# Patient Record
Sex: Male | Born: 1970 | Race: White | Hispanic: No | Marital: Married | Smoking: Current every day smoker
Health system: Southern US, Community
[De-identification: ages and names within clinical notes are randomized; demographics above are authoritative.]

## PROBLEM LIST (undated history)

## (undated) DIAGNOSIS — E119 Type 2 diabetes mellitus without complications: Secondary | ICD-10-CM

## (undated) DIAGNOSIS — I10 Essential (primary) hypertension: Secondary | ICD-10-CM

## (undated) DIAGNOSIS — E7800 Pure hypercholesterolemia, unspecified: Secondary | ICD-10-CM

---

## 2004-03-17 ENCOUNTER — Encounter: Admission: RE | Admit: 2004-03-17 | Discharge: 2004-03-17 | Payer: Self-pay | Admitting: Internal Medicine

## 2004-09-22 ENCOUNTER — Encounter: Admission: RE | Admit: 2004-09-22 | Discharge: 2004-09-22 | Payer: Self-pay | Admitting: Cardiovascular Disease

## 2004-10-03 ENCOUNTER — Ambulatory Visit (HOSPITAL_COMMUNITY): Admission: RE | Admit: 2004-10-03 | Discharge: 2004-10-03 | Payer: Self-pay | Admitting: Cardiovascular Disease

## 2004-11-16 ENCOUNTER — Emergency Department (HOSPITAL_COMMUNITY): Admission: EM | Admit: 2004-11-16 | Discharge: 2004-11-16 | Payer: Self-pay | Admitting: Family Medicine

## 2005-01-30 ENCOUNTER — Emergency Department (HOSPITAL_COMMUNITY): Admission: EM | Admit: 2005-01-30 | Discharge: 2005-01-30 | Payer: Self-pay | Admitting: Emergency Medicine

## 2010-05-19 ENCOUNTER — Encounter: Admission: RE | Admit: 2010-05-19 | Discharge: 2010-05-19 | Payer: Self-pay | Admitting: Internal Medicine

## 2010-06-18 ENCOUNTER — Encounter: Admission: RE | Admit: 2010-06-18 | Discharge: 2010-06-18 | Payer: Self-pay | Admitting: Internal Medicine

## 2010-12-12 NOTE — Cardiovascular Report (Signed)
NAME:  DIMITRIY, CARRERAS.:  [id number]   MEDICAL RECORD NO.:  [id number]          PATIENT TYPE:  OIB   LOCATION:  2889                         FACILITY:  [facility]   PHYSICIAN:  Nanetta Batty, M.D.   DATE OF BIRTH:  10-26-1970   DATE OF PROCEDURE:  10/03/2004  DATE OF DISCHARGE:                              CARDIAC CATHETERIZATION   INDICATIONS:  Mr. Cassatt is a 40 year old gentleman patient of Dr. Ralene Ok who was seen in the office September 22, 2004 with chest pain.  He  has newly diagnosed diabetes and hyperlipidemia.  He has a brother who has  Marfan syndrome and mother that had bypass surgery.  Cardiolite stress test  was nondiagnostic, but because of ongoing chest pain presents now for  diagnostic coronary arteriography o rule out ischemic etiology.   PROCEDURE DESCRIPTION:  The patient was brought to the second floor [facility] in the postabsorptive state.  He was  premedicated with p.o. Valium and IV Versed.  His right groin was prepped  and shaved in the usual sterile fashion.  1% Xylocaine was used for local  anesthesia.  A 6 French sheath was inserted into the right femoral artery  using standard Seldinger technique. A 6 French right and left Judkins  diagnostic catheter as well as 6 French pigtail catheter were used for  selective coronary angiography, left ventriculography respectively.  Visipaque dye was used for the entirety of the case. Retrograde aortic,  ventricular, and  pullback pressures were recorded.   HEMODYNAMICS:  1.  Aortic systolic pressure 120, diastolic pressure 67.  2.  Left ventricular systolic pressure 120, end-diastolic pressure 16.   SELECTIVE CORONARY ANGIOGRAPHY:  1.  Left main normal.  2.  LAD:  Normal.  3.  Left circumflex:  Normal.  4.  Right coronary artery:  Dominant and normal.   LEFT VENTRICULOGRAPHY:  RAO left ventriculogram was performed using 25 mL of  Visipaque dye at 12 mL per  second.  The overall LVEF is estimated greater  than 60% without focal wall motion abnormalities.   IMPRESSION:  Mr. Amado Nash has essentially normal coronary arteries, normal LV  function.  I believe his chest pain is noncardiac.  Sheaths were removed and  pressure was held on the groin to achieve hemostasis.  The patient left the  lab in stable condition.  He will be discharged home today as an outpatient.  He will see me back in the office in 1-2 weeks in followup.      JB/MEDQ  D:  10/03/2004  T:  10/03/2004  Job:  [id number]   cc:   [facility]  [address]  [place], [place] [address]   Ralene Ok, M.D.  [address]  [place]  [place] [address]  Fax: [phone]

## 2012-10-02 ENCOUNTER — Emergency Department (INDEPENDENT_AMBULATORY_CARE_PROVIDER_SITE_OTHER)
Admission: EM | Admit: 2012-10-02 | Discharge: 2012-10-02 | Disposition: A | Discharge status: Home / Self Care | Payer: 59 | Source: Home / Self Care

## 2012-10-02 ENCOUNTER — Encounter (HOSPITAL_COMMUNITY): Payer: Self-pay | Admitting: Emergency Medicine

## 2012-10-02 DIAGNOSIS — S61412A Laceration without foreign body of left hand, initial encounter: Secondary | ICD-10-CM

## 2012-10-02 DIAGNOSIS — S61409A Unspecified open wound of unspecified hand, initial encounter: Secondary | ICD-10-CM

## 2012-10-02 HISTORY — DX: Type 2 diabetes mellitus without complications: E11.9

## 2012-10-02 HISTORY — DX: Pure hypercholesterolemia, unspecified: E78.00

## 2012-10-02 HISTORY — DX: Essential (primary) hypertension: I10

## 2012-10-02 NOTE — ED Provider Notes (Signed)
History     CSN: [id number]  Arrival date & time 10/02/12  1630   First MD Initiated Contact with Patient 10/02/12 1713      Chief Complaint  Patient presents with  . Laceration    (Consider location/radiation/quality/duration/timing/severity/associated sxs/prior treatment) Patient is a 42 y.o. male presenting with skin laceration. The history is provided by the patient. No language interpreter was used.  Laceration Location:  Hand Hand laceration location:  L hand Length (cm):  0.4 Depth:  Through muscle Bleeding: venous and controlled with pressure   Laceration mechanism:  Knife Pain details:    Quality:  Tingling   Timing:  Constant Foreign body present:  No foreign bodies Tetanus status:  Up to date   Past Medical History  Diagnosis Date  . Hypertension   . High cholesterol   . Diabetes mellitus without complication     History reviewed. No pertinent past surgical history.  No family history on file.  History  Substance Use Topics  . Smoking status: Former Games developer  . Smokeless tobacco: Not on file  . Alcohol Use: Yes      Review of Systems  Skin: Positive for wound.  All other systems reviewed and are negative.    Allergies  Review of patient's allergies indicates no known allergies.  Home Medications   Current Outpatient Rx  Name  Route  Sig  Dispense  Refill  . aspirin 81 MG tablet   Oral   Take 81 mg by mouth daily.         Marland Kitchen atorvastatin (LIPITOR) 20 MG tablet   Oral   Take 20 mg by mouth daily.         Marland Kitchen HYDROcodone-acetaminophen (NORCO/VICODIN) 5-325 MG per tablet   Oral   Take 1 tablet by mouth every 6 (six) hours as needed for pain.         . Liraglutide (VICTOZA [place])   Subcutaneous   Inject into the skin.         . MetFORMIN HCl (GLUMETZA PO)   Oral   Take by mouth.         . Valsartan (DIOVAN PO)   Oral   Take by mouth.           BP 146/89  Pulse 87  Temp(Src) 98.5 F (36.9 C) (Oral)  Resp 21  SpO2  96%  Physical Exam  Nursing note and vitals reviewed. Constitutional: He is oriented to person, place, and time. He appears well-developed and well-nourished.  HENT:  Head: Normocephalic.  Musculoskeletal: He exhibits tenderness.  Neurological: He is alert and oriented to person, place, and time.  Skin:  4 mm punture/laceration    ED Course  LACERATION REPAIR Date/Time: 10/02/2012 5:31 PM Performed by: Elson Areas Authorized by: Calvert Cantor Consent: Verbal consent obtained. Risks and benefits: risks, benefits and alternatives were discussed Consent given by: patient Time out: Immediately prior to procedure a "time out" was called to verify the correct patient, procedure, equipment, support staff and site/side marked as required. Body area: upper extremity Laceration length: 0.4 cm Foreign bodies: no foreign bodies Vascular damage: no Anesthesia: local infiltration Local anesthetic: lidocaine 2% with epinephrine Irrigation solution: saline Skin closure: 5-0 Prolene Number of sutures: 1 Technique: simple Approximation: close Approximation difficulty: simple Patient tolerance: Patient tolerated the procedure well with no immediate complications.   (including critical care time)  Labs Reviewed - No data to display No results found.   No diagnosis found.  MDM  Pt has decreased range of motion,  Pt has thenar swelling which I think is causing.   I advised follow up with Dr. Wetzel Bjornstad.   I doubt tendon injury.  No significant vascular injury.           Lonia Skinner Kane, PA-C 10/02/12 1733

## 2012-10-02 NOTE — ED Notes (Signed)
Laceration to left hand, area between index finger and thumb.  Bleeding controlled

## 2016-03-17 ENCOUNTER — Other Ambulatory Visit: Payer: Self-pay | Admitting: Internal Medicine

## 2016-03-17 DIAGNOSIS — I70223 Atherosclerosis of native arteries of extremities with rest pain, bilateral legs: Secondary | ICD-10-CM

## 2016-03-27 ENCOUNTER — Ambulatory Visit
Admission: RE | Admit: 2016-03-27 | Discharge: 2016-03-27 | Disposition: A | Discharge status: Home / Self Care | Payer: 59 | Source: Ambulatory Visit | Attending: Internal Medicine | Admitting: Internal Medicine

## 2016-03-27 DIAGNOSIS — I70223 Atherosclerosis of native arteries of extremities with rest pain, bilateral legs: Secondary | ICD-10-CM

## 2016-07-17 ENCOUNTER — Ambulatory Visit
Admission: RE | Admit: 2016-07-17 | Discharge: 2016-07-17 | Disposition: A | Discharge status: Home / Self Care | Payer: 59 | Source: Ambulatory Visit | Attending: Internal Medicine | Admitting: Internal Medicine

## 2016-07-17 ENCOUNTER — Other Ambulatory Visit: Payer: Self-pay | Admitting: Internal Medicine

## 2016-07-17 DIAGNOSIS — R0600 Dyspnea, unspecified: Secondary | ICD-10-CM

## 2016-07-17 DIAGNOSIS — R059 Cough, unspecified: Secondary | ICD-10-CM

## 2016-07-17 DIAGNOSIS — R05 Cough: Secondary | ICD-10-CM

## 2016-07-17 DIAGNOSIS — R634 Abnormal weight loss: Secondary | ICD-10-CM

## 2016-07-17 DIAGNOSIS — F17200 Nicotine dependence, unspecified, uncomplicated: Secondary | ICD-10-CM

## 2016-12-02 ENCOUNTER — Ambulatory Visit (INDEPENDENT_AMBULATORY_CARE_PROVIDER_SITE_OTHER): Payer: 59

## 2016-12-02 ENCOUNTER — Ambulatory Visit (HOSPITAL_COMMUNITY)
Admission: EM | Admit: 2016-12-02 | Discharge: 2016-12-02 | Disposition: A | Discharge status: Home / Self Care | Payer: 59 | Attending: Internal Medicine | Admitting: Internal Medicine

## 2016-12-02 ENCOUNTER — Encounter (HOSPITAL_COMMUNITY): Payer: Self-pay | Admitting: Emergency Medicine

## 2016-12-02 DIAGNOSIS — M79602 Pain in left arm: Secondary | ICD-10-CM | POA: Diagnosis not present

## 2016-12-02 DIAGNOSIS — S52262A Displaced segmental fracture of shaft of ulna, left arm, initial encounter for closed fracture: Secondary | ICD-10-CM

## 2016-12-02 DIAGNOSIS — S52263A Displaced segmental fracture of shaft of ulna, unspecified arm, initial encounter for closed fracture: Secondary | ICD-10-CM

## 2016-12-02 MED ORDER — IBUPROFEN 600 MG PO TABS: 600.0000 mg | ORAL_TABLET | Freq: Four times a day (QID) | ORAL | 0 refills | Status: AC | PRN

## 2016-12-02 MED ORDER — KETOROLAC TROMETHAMINE 30 MG/ML IJ SOLN
30.0000 mg | Freq: Once | INTRAMUSCULAR | Status: AC
  Administered 2016-12-02: 30 mg via INTRAMUSCULAR

## 2016-12-02 MED ORDER — HYDROCODONE-ACETAMINOPHEN 5-325 MG PO TABS: 1.0000 | ORAL_TABLET | ORAL | 0 refills | Status: AC | PRN

## 2016-12-02 MED ORDER — KETOROLAC TROMETHAMINE 30 MG/ML IJ SOLN
INTRAMUSCULAR | Status: AC
Start: 2016-12-02 — End: 2016-12-02
  Filled 2016-12-02: qty 1
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
## 2016-12-02 NOTE — ED Notes (Signed)
Provided ice pack

## 2016-12-02 NOTE — Progress Notes (Signed)
Orthopedic Tech Progress Note Patient Details:  [facility]Fred [facility] 12/11/1970 [id number]  Ortho Devices Type of Ortho Device: Ace wrap, Arm sling, Sugartong splint Ortho Device/Splint Location: LUE Ortho Device/Splint Interventions: Ordered, Application   Jennye MoccasinHughes, Helaman Mecca Craig 12/02/2016, 9:31 PM
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
## 2016-12-02 NOTE — Discharge Instructions (Signed)
Keep the arm elevated. Keep the splint dry. He may remove the sling periodically to move the shoulder around to keep it from getting stiff. For any problems with circulation or other problems seek medical attention promptly.

## 2016-12-07 ENCOUNTER — Ambulatory Visit (INDEPENDENT_AMBULATORY_CARE_PROVIDER_SITE_OTHER): Payer: 59 | Admitting: Orthopaedic Surgery

## 2016-12-07 DIAGNOSIS — S52232A Displaced oblique fracture of shaft of left ulna, initial encounter for closed fracture: Secondary | ICD-10-CM | POA: Insufficient documentation

## 2016-12-07 MED ORDER — CALCIUM CARBONATE-VITAMIN D 500-200 MG-UNIT PO TABS: 1.0000 | ORAL_TABLET | Freq: Three times a day (TID) | ORAL | 12 refills | Status: AC

## 2016-12-07 MED ORDER — ZINC SULFATE 220 (50 ZN) MG PO CAPS: 220.0000 mg | ORAL_CAPSULE | Freq: Every day | ORAL | 0 refills | Status: AC
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
## 2016-12-22 ENCOUNTER — Ambulatory Visit (INDEPENDENT_AMBULATORY_CARE_PROVIDER_SITE_OTHER): Payer: 59

## 2016-12-22 ENCOUNTER — Ambulatory Visit (INDEPENDENT_AMBULATORY_CARE_PROVIDER_SITE_OTHER): Payer: 59 | Admitting: Orthopaedic Surgery

## 2016-12-22 ENCOUNTER — Encounter (INDEPENDENT_AMBULATORY_CARE_PROVIDER_SITE_OTHER): Payer: Self-pay | Admitting: Orthopaedic Surgery

## 2016-12-22 DIAGNOSIS — S52232A Displaced oblique fracture of shaft of left ulna, initial encounter for closed fracture: Secondary | ICD-10-CM | POA: Diagnosis not present

## 2016-12-22 NOTE — Progress Notes (Signed)
Patient is 2 weeks status post isolated ulnar fracture. He has been doing okay. He does admit to lifting 2 pounds dumbbells while in the cast. His x-ray show stable alignment with bridging callus formation. I will like him to continue short arm casting for 2 more weeks. Follow-up in 2 weeks with repeat 2 view x-rays of the left forearm out of the cast. Continue nonweightbearing. I instructed the patient that he does not need to be lifting dumbbells no matter how light they are.

## 2017-01-05 ENCOUNTER — Ambulatory Visit (INDEPENDENT_AMBULATORY_CARE_PROVIDER_SITE_OTHER): Payer: 59 | Admitting: Orthopaedic Surgery

## 2017-01-05 ENCOUNTER — Ambulatory Visit (INDEPENDENT_AMBULATORY_CARE_PROVIDER_SITE_OTHER): Payer: 59

## 2017-01-05 DIAGNOSIS — S52232A Displaced oblique fracture of shaft of left ulna, initial encounter for closed fracture: Secondary | ICD-10-CM

## 2017-01-05 NOTE — Progress Notes (Signed)
Patient is 4 weeks status post left ulna fracture. He is feeling much better. His x-ray show increasing bridging callus formation. His exam is benign. He has no tenderness to palpation. At this point discontinue short arm cast and begin removable wrist brace. Also recommended therapy for range of motion and progressive strengthening. Follow up in 6 weeks. 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

## 2017-01-12 ENCOUNTER — Telehealth (INDEPENDENT_AMBULATORY_CARE_PROVIDER_SITE_OTHER): Payer: Self-pay

## 2017-01-12 NOTE — Telephone Encounter (Signed)
Received note from [employer] Kitchens with [employer] Wanting to know patients work status. I faxed last work note from [employer] on 06/12 to [phone]

## 2017-01-26 IMAGING — CR DG CHEST 2V
2 series · 2 of 2 positions shown · non-contrast
Comparison: 09/22/2004

CLINICAL DATA: Cold, cough and runny nose. Significant weight loss.

EXAM:
CHEST  2 VIEW

[w chest pa]
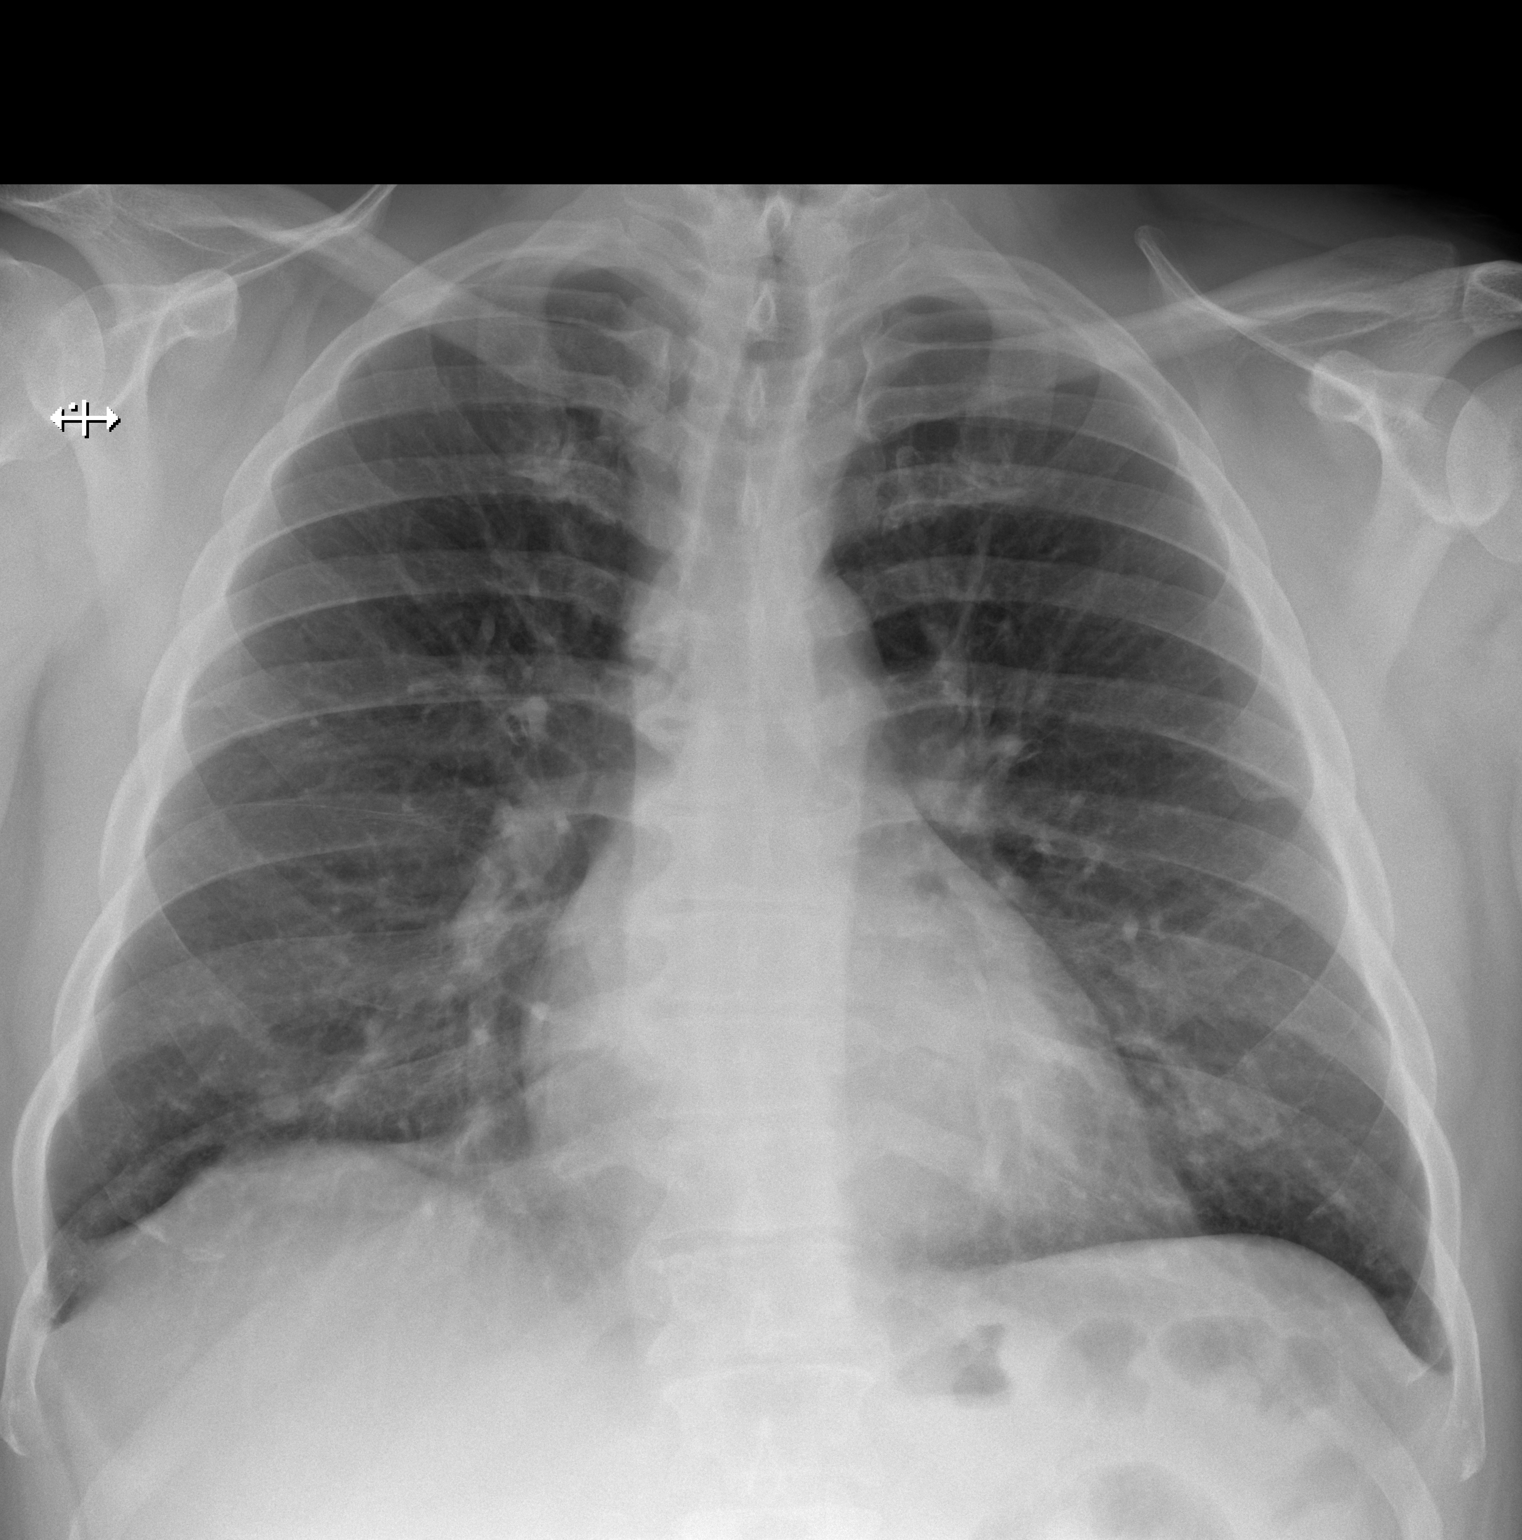

[w chest lat]
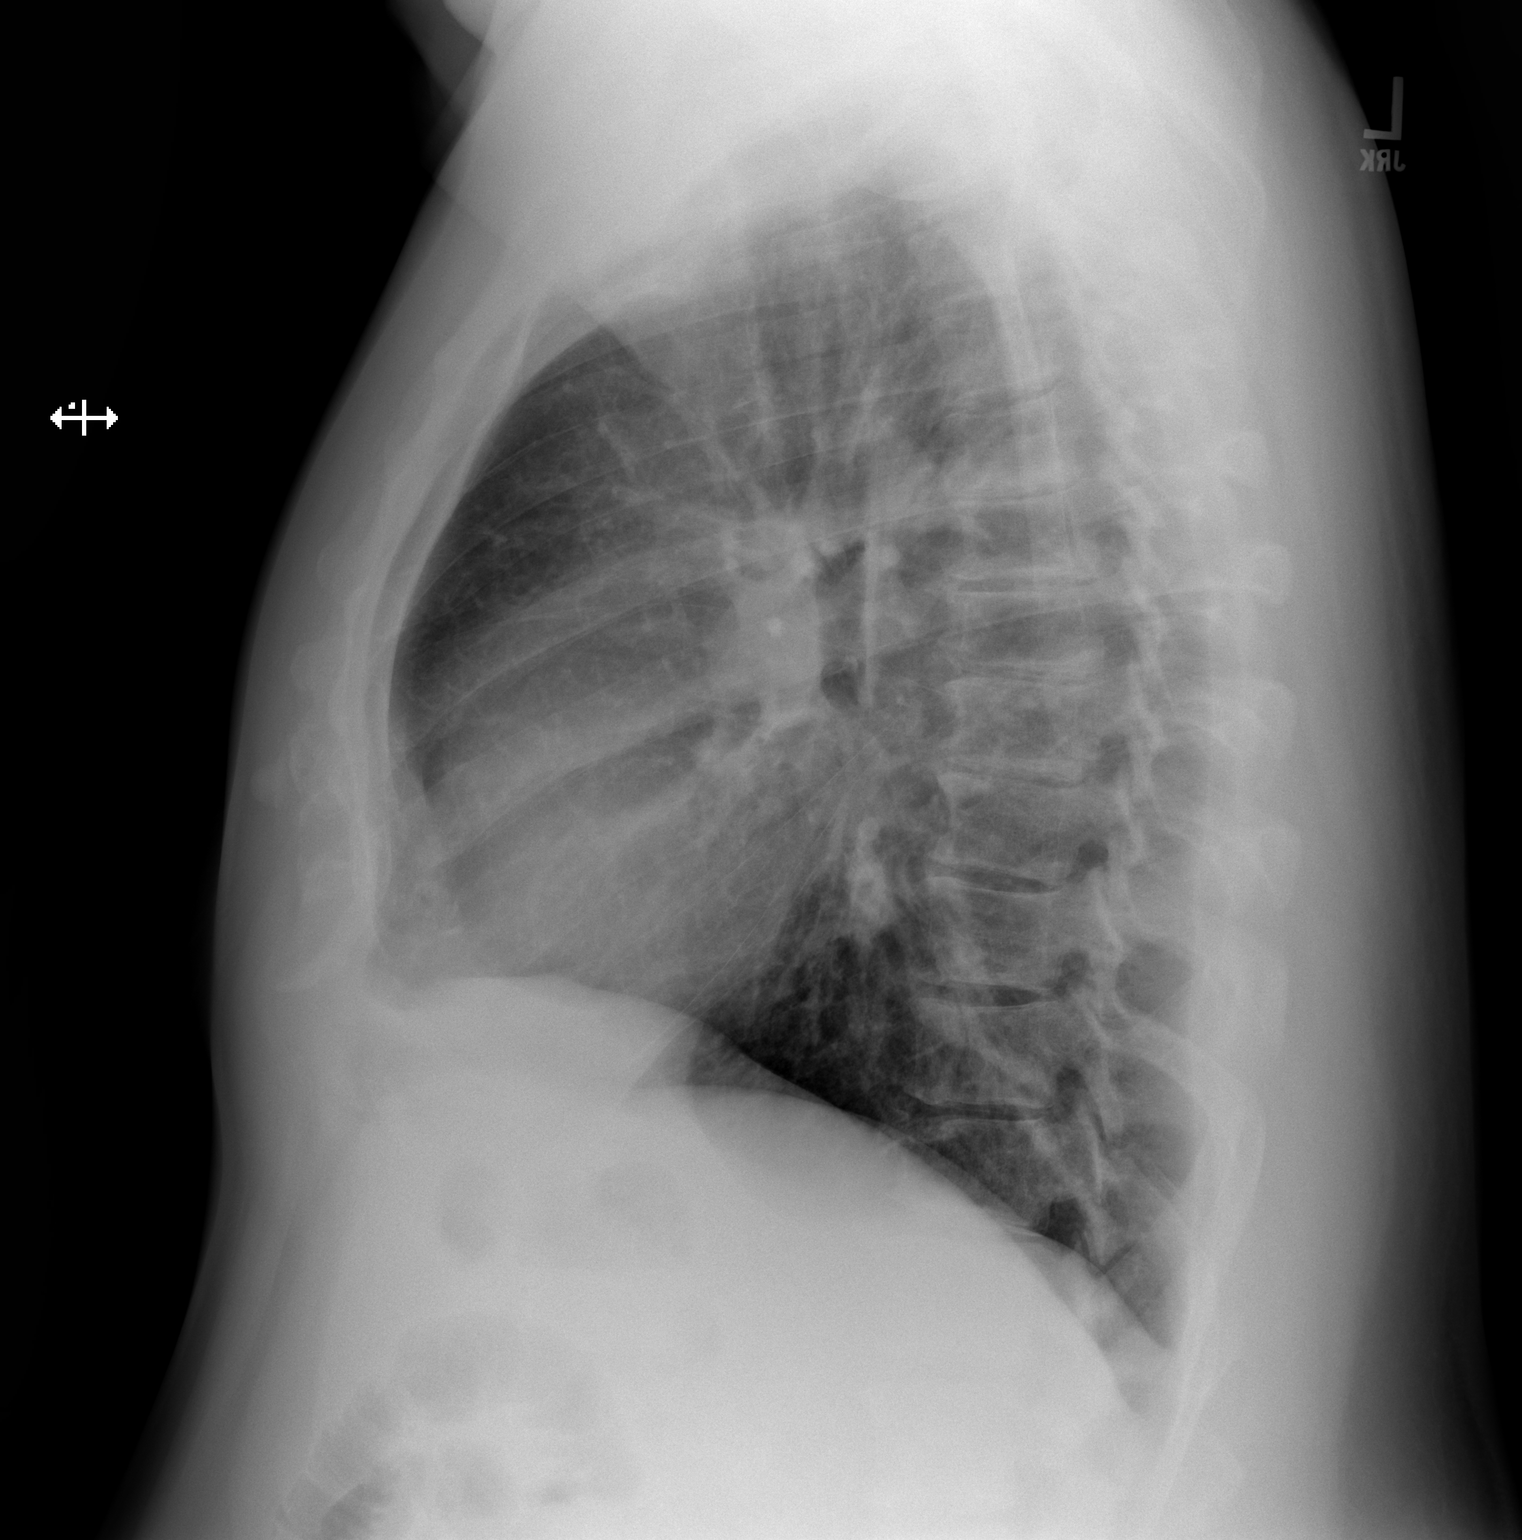

[2 of 2 positions shown; findings below may reference images not displayed]

FINDINGS: Cardiomediastinal silhouette is normal. Mediastinal contours appear
intact.

There is no evidence of focal airspace consolidation, pleural
effusion or pneumothorax.

Osseous structures are without acute abnormality. Soft tissues are
grossly normal.
IMPRESSION: No active cardiopulmonary disease.

## 2017-02-12 ENCOUNTER — Ambulatory Visit
Admission: RE | Admit: 2017-02-12 | Discharge: 2017-02-12 | Disposition: A | Discharge status: Home / Self Care | Payer: 59 | Source: Ambulatory Visit | Attending: Internal Medicine | Admitting: Internal Medicine

## 2017-02-12 ENCOUNTER — Other Ambulatory Visit: Payer: Self-pay | Admitting: Internal Medicine

## 2017-02-12 DIAGNOSIS — R0989 Other specified symptoms and signs involving the circulatory and respiratory systems: Secondary | ICD-10-CM

## 2017-02-12 DIAGNOSIS — R05 Cough: Secondary | ICD-10-CM

## 2017-02-12 DIAGNOSIS — F17200 Nicotine dependence, unspecified, uncomplicated: Secondary | ICD-10-CM

## 2017-02-12 DIAGNOSIS — R059 Cough, unspecified: Secondary | ICD-10-CM

## 2017-02-16 ENCOUNTER — Ambulatory Visit (INDEPENDENT_AMBULATORY_CARE_PROVIDER_SITE_OTHER): Payer: 59 | Admitting: Orthopaedic Surgery

## 2017-08-24 IMAGING — CR DG CHEST 2V
2 series · 2 of 2 positions shown · non-contrast
Comparison: 07/17/2016.  09/22/2004 .

CLINICAL DATA: Cough and congestion .

EXAM:
CHEST  2 VIEW

[w chest pa]
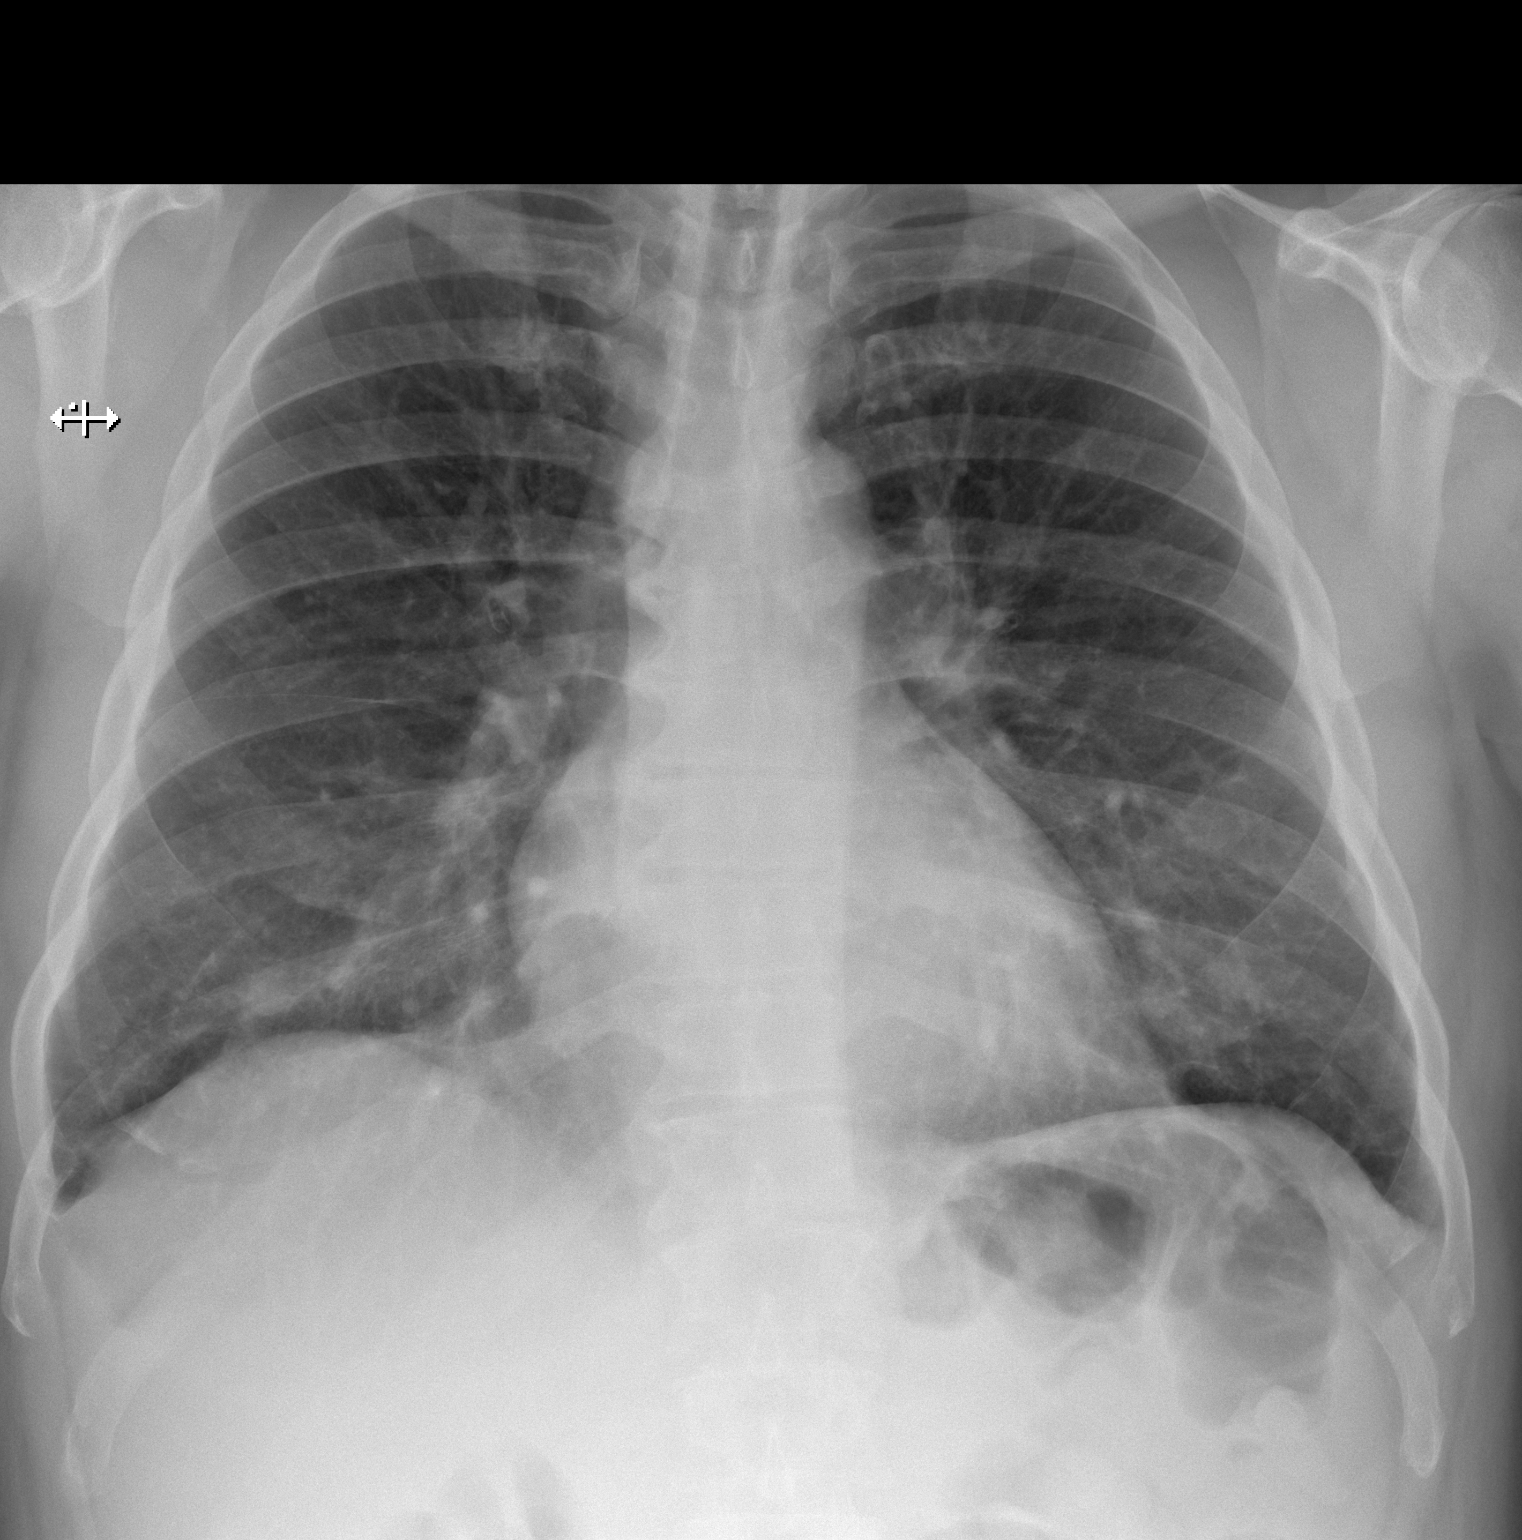

[w chest lat]
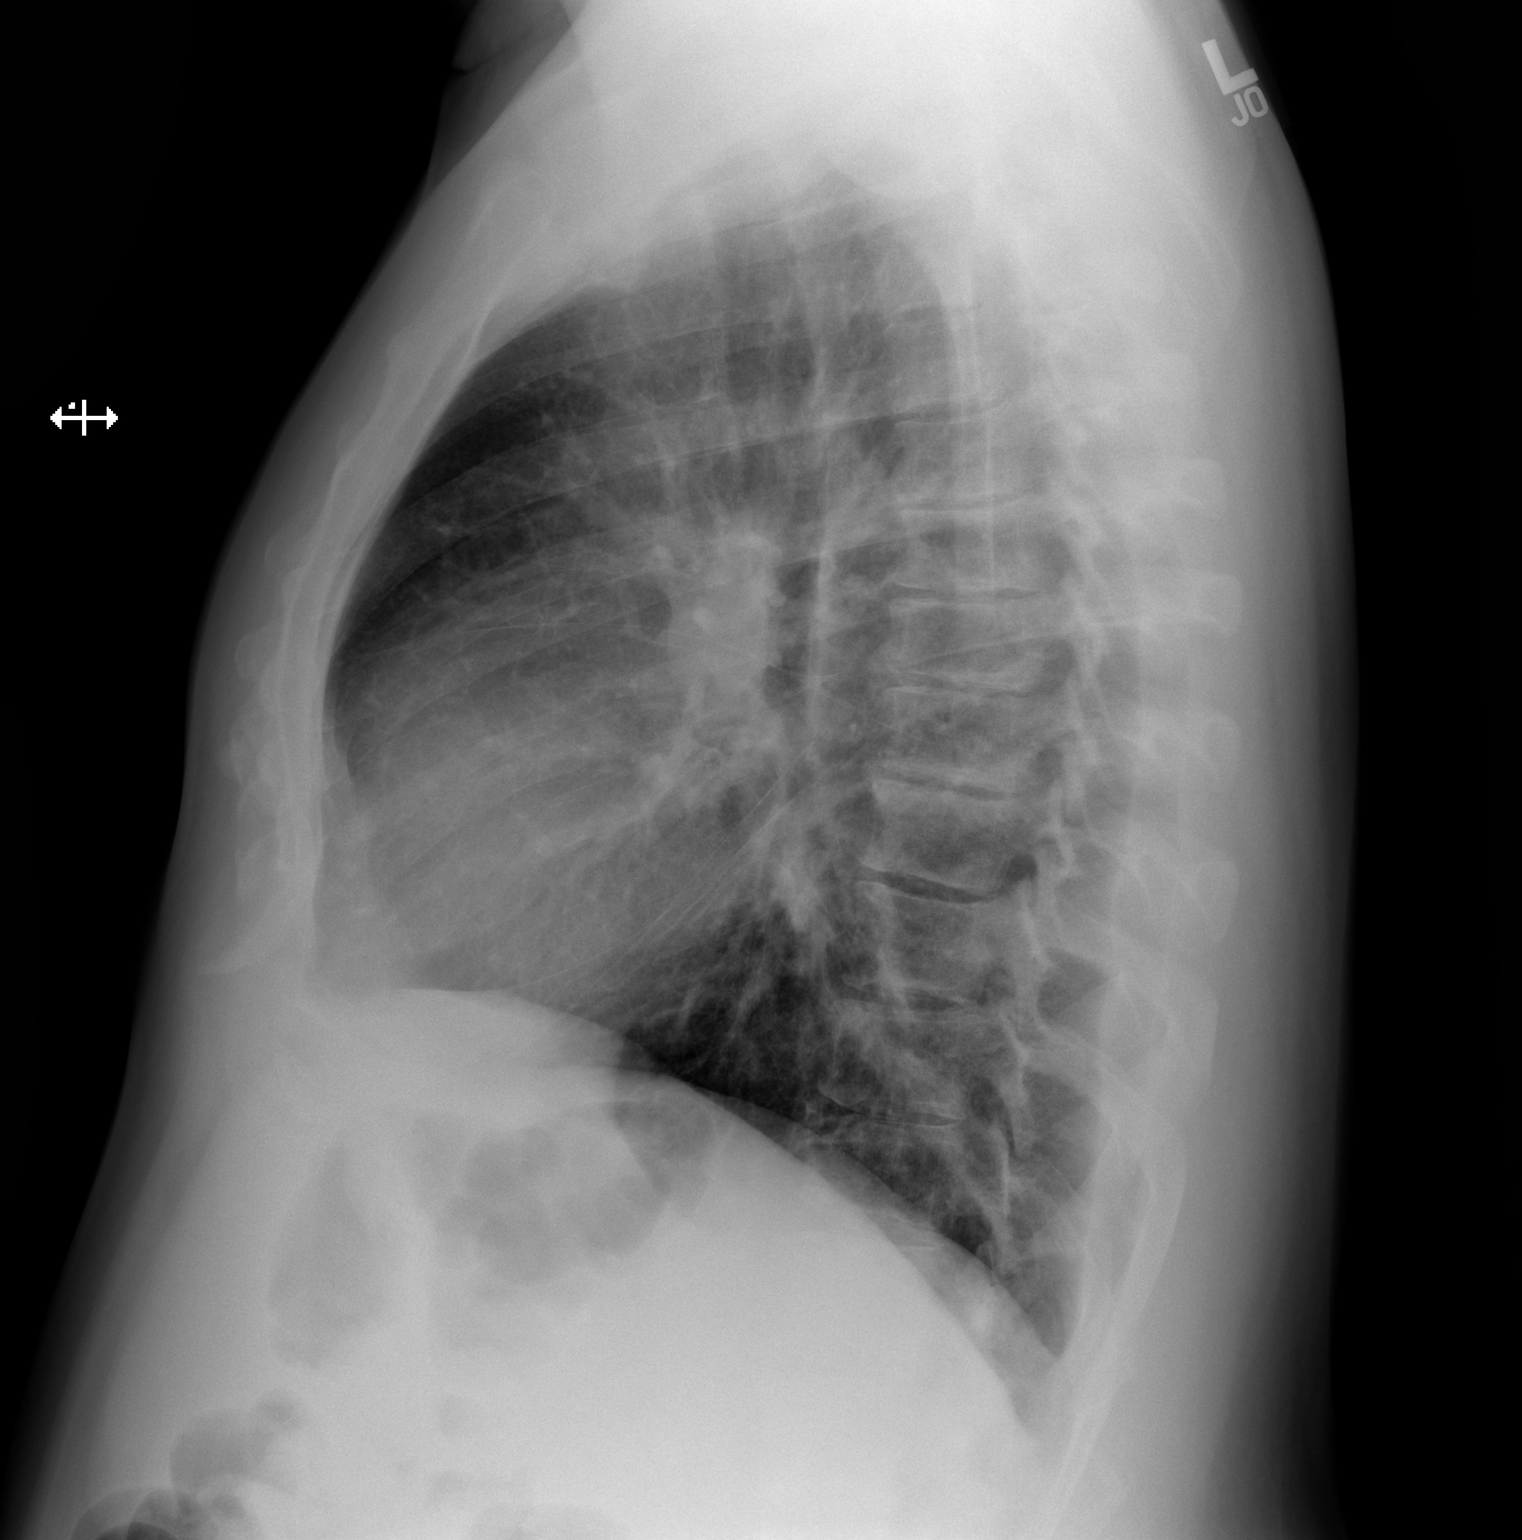

[2 of 2 positions shown; findings below may reference images not displayed]

FINDINGS: Mediastinum hilar structures are normal. Heart size state. Mild
bibasilar atelectasis and/or scarring. Similar findings noted on
prior exams. Bilateral nipple shadows again noted unchanged.
IMPRESSION: Low lung volumes with mild basilar atelectasis. Stable exam from
prior exams.

## 2019-12-07 ENCOUNTER — Other Ambulatory Visit: Payer: Self-pay | Admitting: Internal Medicine

## 2019-12-07 DIAGNOSIS — Z720 Tobacco use: Secondary | ICD-10-CM

## 2020-02-02 ENCOUNTER — Ambulatory Visit
Admission: RE | Admit: 2020-02-02 | Discharge: 2020-02-02 | Disposition: A | Discharge status: Home / Self Care | Payer: 59 | Source: Ambulatory Visit | Attending: Internal Medicine | Admitting: Internal Medicine

## 2020-02-02 DIAGNOSIS — Z720 Tobacco use: Secondary | ICD-10-CM

## 2020-02-28 ENCOUNTER — Ambulatory Visit: Payer: Self-pay | Admitting: Cardiology

## 2023-02-21 ENCOUNTER — Emergency Department (HOSPITAL_COMMUNITY)
Admission: EM | Admit: 2023-02-21 | Discharge: 2023-02-21 | Disposition: A | Discharge status: Home / Self Care | Payer: BLUE CROSS/BLUE SHIELD | Attending: Emergency Medicine | Admitting: Emergency Medicine

## 2023-02-21 ENCOUNTER — Other Ambulatory Visit: Payer: Self-pay

## 2023-02-21 ENCOUNTER — Encounter (HOSPITAL_COMMUNITY): Payer: Self-pay

## 2023-02-21 ENCOUNTER — Ambulatory Visit (HOSPITAL_COMMUNITY)
Admission: EM | Admit: 2023-02-21 | Discharge: 2023-02-21 | Disposition: A | Discharge status: Home / Self Care | Payer: BLUE CROSS/BLUE SHIELD

## 2023-02-21 DIAGNOSIS — Y990 Civilian activity done for income or pay: Secondary | ICD-10-CM | POA: Diagnosis not present

## 2023-02-21 DIAGNOSIS — W268XXA Contact with other sharp object(s), not elsewhere classified, initial encounter: Secondary | ICD-10-CM | POA: Diagnosis not present

## 2023-02-21 DIAGNOSIS — T1501XA Foreign body in cornea, right eye, initial encounter: Secondary | ICD-10-CM | POA: Diagnosis present

## 2023-02-21 DIAGNOSIS — T1591XA Foreign body on external eye, part unspecified, right eye, initial encounter: Secondary | ICD-10-CM

## 2023-02-21 MED ORDER — FLUORESCEIN SODIUM 1 MG OP STRP
1.0000 | ORAL_STRIP | Freq: Once | OPHTHALMIC | Status: AC
  Administered 2023-02-21: 1 via OPHTHALMIC
  Filled 2023-02-21: qty 1

## 2023-02-21 MED ORDER — TETRACAINE HCL 0.5 % OP SOLN
1.0000 [drp] | Freq: Once | OPHTHALMIC | Status: AC
  Administered 2023-02-21: 2 [drp] via OPHTHALMIC
  Filled 2023-02-21: qty 4

## 2023-02-21 MED ORDER — CIPROFLOXACIN HCL 0.3 % OP SOLN: 2.0000 [drp] | Freq: Four times a day (QID) | OPHTHALMIC | 0 refills | Status: AC

## 2023-02-21 MED ORDER — CIPROFLOXACIN HCL 0.3 % OP SOLN
2.0000 [drp] | Freq: Once | OPHTHALMIC | Status: AC
  Administered 2023-02-21: 2 [drp] via OPHTHALMIC
  Filled 2023-02-21: qty 2.5
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
## 2023-02-21 NOTE — ED Triage Notes (Signed)
Patient here today with c/o metal shaving in right eye yesterday. Patient has been having increased redness, swelling, and pain since. The metal piece is still in eye.

## 2023-02-21 NOTE — Discharge Instructions (Addendum)
You were seen in the ER for a foreign object in the eye. I was able to remove this for you here in the ER. You will be on antibiotic eye drops for the next few days to prevent infection in this eye. Please follow up with an ophthalmologist for further evaluation. I have given you the information for Dr. Essie Hart who is an ophthalmologist in our area. Return to the ER if your symptoms are worsening or not improving.

## 2023-02-21 NOTE — ED Triage Notes (Signed)
Pt arrived here via POV. C/o eye irritation d/t metal fragment in eye for 2x days.  Pt was seen at [facility] and referred here. Eye is red, pain 2/10.

## 2023-02-21 NOTE — ED Provider Notes (Signed)
[place] EMERGENCY DEPARTMENT AT [facility] Provider Note   CSN: [id number] Arrival date & time: 02/21/23  1608     History Chief Complaint  Patient presents with   Foreign Body in Eye    Curtis Wells is a 52 y.o. male. Patient presented to the ER with concerns of a foreign body in the right eye. Occurred about 2 days ago. States that he was working with metal and feels that a small metal shaving floated into his eye. Denies any obvious vision changes. States that the eye feels a bit pain and irritated and having difficulty keeping his eye open. Was seen at urgent care earlier today and advised to come into the ED for further evaluation. Has not seen an ophthalmologist for this yet.   Foreign Body in Eye       Home Medications Prior to Admission medications   Medication Sig Start Date End Date Taking? Authorizing Provider  ciprofloxacin (CILOXAN) 0.3 % ophthalmic solution Place 2 drops into the right eye every 6 (six) hours for 5 days. Administer 1 drop, every 2 hours, while awake, for 2 days. Then 1 drop, every 4 hours, while awake, for the next 5 days. 02/21/23 02/26/23 Yes Smitty Knudsen, PA-C  aspirin 81 MG tablet Take 81 mg by mouth daily.    [provider]  atorvastatin (LIPITOR) 20 MG tablet Take 20 mg by mouth daily.    [provider]  Azilsartan-Chlorthalidone (EDARBYCLOR PO) Take by mouth.    [provider]  calcium-vitamin D (OSCAL WITH D) 500-200 MG-UNIT tablet Take 1 tablet by mouth 3 (three) times daily. 12/07/16   Tarry Kos, MD  HYDROcodone-acetaminophen (NORCO/VICODIN) 5-325 MG tablet Take 1 tablet by mouth every 4 (four) hours as needed. 12/02/16   Hayden Rasmussen, NP  ibuprofen (ADVIL,MOTRIN) 600 MG tablet Take 1 tablet (600 mg total) by mouth every 6 (six) hours as needed. 12/02/16   Hayden Rasmussen, NP  zinc sulfate 220 (50 Zn) MG capsule Take 1 capsule (220 mg total) by mouth daily. 12/07/16   Tarry Kos, MD      Allergies     Patient has no known allergies.    Review of Systems   Review of Systems  Eyes:  Positive for pain and redness.  All other systems reviewed and are negative.   Physical Exam Updated Vital Signs BP (!) 143/97 (BP Location: Left Arm)   Pulse 65   Temp 98.5 F (36.9 C) (Oral)   Resp 18   SpO2 99%  Physical Exam Vitals and nursing note reviewed.  HENT:     Head: Normocephalic and atraumatic.  Eyes:     General: No scleral icterus.       Right eye: No discharge.        Left eye: No discharge.     Extraocular Movements: Extraocular movements intact.     Conjunctiva/sclera: Conjunctivae normal.     Pupils: Pupils are equal, round, and reactive to light.      Comments: Small brown speck located around the 9 o'clock position. No obvious hemorrhage or visual disturbance noted on gross examination.  Cardiovascular:     Rate and Rhythm: Normal rate and regular rhythm.  Skin:    Findings: No rash.     ED Results / Procedures / Treatments   Labs (all labs ordered are listed, but only abnormal results are displayed) Labs Reviewed - No data to display  EKG None  Radiology No results  found.  Procedures .Foreign Body Removal  Date/Time: 02/21/2023 5:52 PM  Performed by: Smitty Knudsen, PA-C Authorized by: Smitty Knudsen, PA-C  Consent: Verbal consent obtained. Risks and benefits: risks, benefits and alternatives were discussed Consent given by: patient Patient understanding: patient states understanding of the procedure being performed Patient consent: the patient's understanding of the procedure matches consent given Procedure consent: procedure consent matches procedure scheduled Patient identity confirmed: verbally with patient and arm band Body area: eye Location details: right cornea Anesthesia: see MAR for details  Anesthesia: Local Anesthetic: topical anesthetic  Sedation: Patient sedated: no  Patient restrained: no Patient cooperative: yes Localization  method: slit lamp and visualized Removal mechanism: 27-gauge needle Eye examined with fluorescein. Fluorescein uptake. Corneal abrasion size: medium Corneal abrasion location: lateral No residual rust ring present. Dressing: antibiotic drops Depth: superficial Complexity: simple 1 objects recovered. Objects recovered: Metallic speck Post-procedure assessment: foreign body removed Patient tolerance: patient tolerated the procedure well with no immediate complications     Medications Ordered in ED Medications  tetracaine (PONTOCAINE) 0.5 % ophthalmic solution 1-2 drop (has no administration in time range)  fluorescein ophthalmic strip 1 strip (has no administration in time range)  ciprofloxacin (CILOXAN) 0.3 % ophthalmic solution 2 drop (has no administration in time range)    ED Course/ Medical Decision Making/ A&P                           Medical Decision Making Risk Prescription drug management.   This patient presents to the ED for concern of foreign body in eye.  Differential diagnosis includes corneal abrasion, corneal ulcer, metallic foreign body in eye   Medicines ordered and prescription drug management:  I ordered medication including tetracaine for anesthetic Reevaluation of the patient after these medicines showed that the patient improved I have reviewed the patients home medicines and have made adjustments as needed   Problem List / ED Course:  Patient presents emergency department concerns of a foreign body in his right eye.  Reports that he is ago while he was at work, he believes that a metal shaving flew into his eye and has since then been painful and irritated.  Denies any significant visual changes since the metal structure went to patient's eye.  Was seen in urgent care earlier today who did nausea patient has a small brown speck in the right cornea position at about the 9:00 area. On examination of patient, administered 2 drops of topical tetracaine  which was able to anesthetize patient's eye. Examination did reveal a small brown speck at the 9oclock position in the right cornea that appears superficial. Under slit lamp, was able to remove foreign body with 27G needle. Flouorescein examination does reveal an area of notable uptake under metal speck. Will treat with ciprofloxacin to reduce the risk of infection and have patient follow up with ophthalmologist. Patient is agreeable with treatment plan and verbalized understanding all return precautions. All questions answered prior to patient discharge.  Final Clinical Impression(s) / ED Diagnoses Final diagnoses:  Foreign body of right eye, initial encounter    Rx / DC Orders ED Discharge Orders          Ordered    ciprofloxacin (CILOXAN) 0.3 % ophthalmic solution  Every 6 hours        02/21/23 1752              Smitty Knudsen, PA-C 02/21/23 1804    [place],  Raynelle Fanning, MD 02/21/23 [phone]

## 2023-02-23 NOTE — ED Notes (Signed)
02/23/23 1420 Opened chart to clarify prescription directions for [facility] pharmacist.
